# Patient Record
Sex: Male | Born: 1955 | Race: White | Hispanic: No | Marital: Single
Health system: Southern US, Community
[De-identification: ages and names within clinical notes are randomized; demographics above are authoritative.]

---

## 2013-06-17 ENCOUNTER — Ambulatory Visit: Payer: Self-pay | Admitting: Physician Assistant

## 2013-06-19 ENCOUNTER — Ambulatory Visit: Payer: Self-pay | Admitting: Physician Assistant

## 2013-06-21 LAB — WOUND CULTURE

## 2013-07-03 ENCOUNTER — Ambulatory Visit: Payer: Self-pay | Admitting: Family Medicine

## 2013-07-03 ENCOUNTER — Inpatient Hospital Stay: Payer: Self-pay | Admitting: Internal Medicine

## 2013-07-03 LAB — CBC
HCT: 35.4 % — ABNORMAL LOW (ref 40.0–52.0)
HGB: 12.1 g/dL — AB (ref 13.0–18.0)
MCH: 29.9 pg (ref 26.0–34.0)
MCHC: 34.3 g/dL (ref 32.0–36.0)
MCV: 87 fL (ref 80–100)
PLATELETS: 244 10*3/uL (ref 150–440)
RBC: 4.05 10*6/uL — AB (ref 4.40–5.90)
RDW: 13.8 % (ref 11.5–14.5)
WBC: 13.6 10*3/uL — AB (ref 3.8–10.6)

## 2013-07-03 LAB — COMPREHENSIVE METABOLIC PANEL
ALK PHOS: 97 U/L
Albumin: 3.4 g/dL (ref 3.4–5.0)
Anion Gap: 6 — ABNORMAL LOW (ref 7–16)
BUN: 13 mg/dL (ref 7–18)
Bilirubin,Total: 0.4 mg/dL (ref 0.2–1.0)
Calcium, Total: 8.5 mg/dL (ref 8.5–10.1)
Chloride: 89 mmol/L — ABNORMAL LOW (ref 98–107)
Co2: 28 mmol/L (ref 21–32)
Creatinine: 0.85 mg/dL (ref 0.60–1.30)
EGFR (African American): >60
Glucose: 172 mg/dL — ABNORMAL HIGH (ref 65–99)
Osmolality: 252 (ref 275–301)
POTASSIUM: 3.5 mmol/L (ref 3.5–5.1)
SGOT(AST): 17 U/L (ref 15–37)
SGPT (ALT): 18 U/L (ref 12–78)
Sodium: 123 mmol/L — ABNORMAL LOW (ref 136–145)
TOTAL PROTEIN: 8.1 g/dL (ref 6.4–8.2)

## 2013-07-03 LAB — SEDIMENTATION RATE: ERYTHROCYTE SED RATE: 69 mm/h — AB (ref 0–20)

## 2013-07-04 ENCOUNTER — Ambulatory Visit: Payer: Self-pay

## 2013-07-04 LAB — HEMOGLOBIN A1C: HEMOGLOBIN A1C: 8.4 % — AB (ref 4.2–6.3)

## 2013-07-04 LAB — VANCOMYCIN, TROUGH: VANCOMYCIN, TROUGH: 23 ug/mL — AB (ref 10–20)

## 2013-07-05 LAB — CBC WITH DIFFERENTIAL/PLATELET
BASOS ABS: 0.1 10*3/uL (ref 0.0–0.1)
BASOS PCT: 1.3 %
EOS PCT: 3.6 %
Eosinophil #: 0.3 10*3/uL (ref 0.0–0.7)
HCT: 34.8 % — ABNORMAL LOW (ref 40.0–52.0)
HGB: 12 g/dL — AB (ref 13.0–18.0)
LYMPHS PCT: 13.1 %
Lymphocyte #: 1.1 10*3/uL (ref 1.0–3.6)
MCH: 30.2 pg (ref 26.0–34.0)
MCHC: 34.6 g/dL (ref 32.0–36.0)
MCV: 87 fL (ref 80–100)
MONOS PCT: 11.3 %
Monocyte #: 0.9 x10 3/mm (ref 0.2–1.0)
Neutrophil #: 5.9 10*3/uL (ref 1.4–6.5)
Neutrophil %: 70.7 %
Platelet: 267 10*3/uL (ref 150–440)
RBC: 3.99 10*6/uL — ABNORMAL LOW (ref 4.40–5.90)
RDW: 13.3 % (ref 11.5–14.5)
WBC: 8.4 10*3/uL (ref 3.8–10.6)

## 2013-07-08 LAB — CULTURE, BLOOD (SINGLE)

## 2013-07-09 LAB — WOUND CULTURE

## 2013-07-10 LAB — PATHOLOGY REPORT

## 2013-07-29 ENCOUNTER — Ambulatory Visit: Payer: Self-pay | Admitting: Podiatry

## 2013-07-29 LAB — CBC WITH DIFFERENTIAL/PLATELET
BASOS ABS: 0.1 10*3/uL (ref 0.0–0.1)
BASOS PCT: 1.1 %
Eosinophil #: 0.8 10*3/uL — ABNORMAL HIGH (ref 0.0–0.7)
Eosinophil %: 8 %
HCT: 32.8 % — ABNORMAL LOW (ref 40.0–52.0)
HGB: 11.4 g/dL — AB (ref 13.0–18.0)
Lymphocyte #: 1.6 10*3/uL (ref 1.0–3.6)
Lymphocyte %: 16.6 %
MCH: 30.2 pg (ref 26.0–34.0)
MCHC: 34.8 g/dL (ref 32.0–36.0)
MCV: 87 fL (ref 80–100)
MONO ABS: 1.1 x10 3/mm — AB (ref 0.2–1.0)
Monocyte %: 11.2 %
NEUTROS PCT: 63.1 %
Neutrophil #: 6.1 10*3/uL (ref 1.4–6.5)
Platelet: 180 10*3/uL (ref 150–440)
RBC: 3.77 10*6/uL — AB (ref 4.40–5.90)
RDW: 14.5 % (ref 11.5–14.5)
WBC: 9.7 10*3/uL (ref 3.8–10.6)

## 2013-08-14 ENCOUNTER — Ambulatory Visit: Payer: Self-pay | Admitting: Podiatry

## 2013-08-18 LAB — WOUND CULTURE

## 2013-08-19 LAB — PATHOLOGY REPORT

## 2013-10-07 ENCOUNTER — Encounter: Payer: Self-pay | Admitting: Surgery

## 2013-10-14 ENCOUNTER — Encounter: Payer: Self-pay | Admitting: Surgery

## 2013-10-15 ENCOUNTER — Encounter: Payer: Self-pay | Admitting: Surgery

## 2013-11-14 ENCOUNTER — Encounter: Payer: Self-pay | Admitting: Surgery

## 2013-11-25 ENCOUNTER — Ambulatory Visit: Payer: Self-pay

## 2014-08-07 NOTE — Consult Note (Signed)
PATIENT NAME:  Andrew Mcguire, Becket B MR#:  [id number] DATE OF BIRTH:  04/14/1956  DATE OF CONSULTATION:  07/04/2013  CONSULTING PHYSICIAN:  Linus Galasodd Tiondra Fang, DPM  REASON FOR CONSULTATION: This is a 59 year old male with a history of a sore on his right great toe that started back in January. He denies any specific injury. He did present a couple of times to an acute care, where he was diagnosed with an infection and given some antibiotics. He states that this area started as a callus. The infection continued to progress, and he was sent to the Emergency Department due to the extent of the infection. Records show that the patient is diabetic with some neuropathy, but the patient states in his own words that he did not know he was diabetic until this admission. He states he has talked to his primary care doctor on several occasions over the last 4 to 5 years about poor circulation because he has noticed the hair growth in his feet disappearing. This was apparently never addressed in the past.   PAST MEDICAL HISTORY:  1. Hypertension for the last 20 years.  2. Diabetes mellitus.  3. Chronic kidney disease.   PAST SURGICAL HISTORY:  1. Benign bone tumor, left leg, age 59.  2. Tonsillectomy.   ALLERGIES: No known drug allergies.   HOME MEDICATIONS:  1. Hydrochlorothiazide 25 mg daily.  2. Glimepiride 1 mg daily.  3. Gabapentin 300 mg 3 times a day.  4. Enalapril 40 mg once a day.  5. Atorvastatin 40 mg daily.  6. Aspirin 81 mg daily.   FAMILY HISTORY: Glaucoma, cancer.   SOCIAL HISTORY: The patient relates a 1-pack-a-day smoking habit with 40- to 45-pack-year  history. Drinks 2 to 3 beers daily. He is widowed. 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   REVIEW OF SYSTEMS:  CONSTITUTIONAL: Denies any fever, chills, weight loss or generalized weakness.  EYES: Denies vision changes.  EARS, NOSE, THROAT: No hearing changes.  RESPIRATORY: Mild productive cough.  CARDIOVASCULAR: Denies  chest pains or palpitations.  GASTROINTESTINAL: No nausea, vomiting or pain.  GENITOURINARY: No dysuria or incontinence.  ENDOCRINE: History of diabetes per records. The patient denies previous knowledge. HEMATOLOGIC: No bruising or bleeding.  SKIN: Some redness and swelling with some black discoloration on the right great toe. Some drainage.  MUSCULOSKELETAL: Adequate range of motion and muscle testing.  NEUROLOGIC: Loss of protective threshold as well as absence of proprioception.   PHYSICAL EXAMINATION:  VASCULAR: DP pulse is 1/4 on the left, trace at best on the right. PT pulse could not be palpated bilateral. Capillary filling time a little delayed on the toes on the right as compared to the left.  NEUROLOGICAL: Loss of protective threshold in the toes bilateral. Proprioception is impaired.  INTEGUMENT: The skin is warm, dry and somewhat atrophic, with some mild hyperpigmentation changes. Significant cellulitis in the right great toe with a mild streak extending up onto the midfoot. A black eschar medially over the hallux IPJ area with a fluctuant gangrenous change more proximal along the medial side of the great toe. There is a significant malodor suggestive of anaerobic infection.  MUSCULOSKELETAL: Adequate range of motion. Muscle testing is 5/5.   X-RAYS: Multiple views of the right foot reveal some lucency at the medial base of the distal phalanx on the right great toe. On the lateral view, possibly an old fracture present. There are noted to be some early signs of subcutaneous gas on the medial aspect  of the proximal phalanx suggestive of early gangrene.   PLAN: Excisional debridement of some of the devitalized tissue full thickness with some subcutaneous tissue performed at bedside sharply using 15-blade and tissue nippers. The ulcer wound is approximately 3.5 cm x 1.5 cm. A light bandage was applied. The patient will be n.p.o., and we will plan for debridement of the infected tissues  with probable amputation of the great toe later on today. Consent form for the same. We will also consult [facility] as most likely there is some vascular compromise to the right foot at this point. We will plan for surgery later on today after the patient has MRI, which is already scheduled.   ____________________________ Linus Galas, DPM tc:lb D: 07/04/2013 11:24:49 ET T: 07/04/2013 12:14:43 ET JOB#: [id number]  cc: Linus Galas, DPM, <Dictator> Marieta Markov DPM ELECTRONICALLY SIGNED 07/04/2013 16:06

## 2014-08-07 NOTE — Discharge Summary (Signed)
PATIENT NAME:  Andrew Mcguire, Diron B MR#:  [id number] DATE OF BIRTH:  01-16-1956  DATE OF ADMISSION:  07/03/2013 DATE OF DISCHARGE:  07/06/2013  PRIMARY CARE PHYSICIAN: At the [facility].   FINAL DIAGNOSES:  1.  Osteomyelitis status post toe amputation. Outpatient culture grew Acinetobacter.  2.  Peripheral vascular disease status post angioplasty.  3.  Diabetes with neuropathy.  4.  Hypertension.  5.  Hyperlipidemia.  6.  Tobacco abuse.   MEDICATIONS ON DISCHARGE: Include atenolol 100 mg daily, gabapentin 300 mg three  times a day, atorvastatin 40 mg at bedtime, glimepiride 1 mg daily, enalapril 20 mg two tablets daily, aspirin 81 mg daily, acetaminophen hydrocodone 325/5 mg one tablet every 6 hours as needed for pain, Cipro 500 mg every 12 hours for 14 days, Cleocin hydrochloride 300 mg one  tablet every 8 hours for 14 days.   Stop taking hydrochlorothiazide.   HOME HEALTH: None.   DRESSING CARE: Keep dressing, right foot, on until seen by Dr. Alberteen Spindleline in the office.   DIET: Low sodium diet, carbohydrate-controlled diet, regular consistency.   ACTIVITY: As tolerated.   FOLLOWUP: With Dr. Alberteen Spindleline, podiatry, this week; Dr. Wyn Quakerew, vascular surgery, 3 weeks; Dr. Sampson GoonFitzgerald, infectious diseases, 2 weeks; 1 to 2 weeks with your medical doctor.  HOSPITAL COURSE: The patient was admitted 07/03/2013, discharged 07/06/2013. Came in with cellulitis and possible osteomyelitis of the toe. The patient was initially started on vancomycin and Zosyn. I switched the antibiotics on March 21 to meropenem and vancomycin secondary to previous culture growing out acinetobacter.    OPERATIONS: Dr. Alberteen Spindleline did a bedside debridement on 07/04/2013 and took to the operating room for a toe amputation on 07/04/2013. Dr. Wyn Quakerew did an angiogram with angioplasties of the right lower extremity. Please see all of those operative reports.   LABORATORY AND RADIOLOGICAL DATA DURING THE HOSPITAL COURSE: Included a  sedimentation rate of 69, CRP 109, glucose 172, BUN 13, creatinine 0.85, sodium 123, potassium 3.5, chloride 89, CO2 of 28, calcium 8.5. Liver function tests normal range. White blood cell count 13.6, H and H 12.1 and 35.2, platelet count of 244.   Right foot showed poorly defined lucency at the base of the distal phalanx of the great toe suspicious for osteomyelitis.   Blood cultures negative. Hemoglobin A1c 8.4.   EKG: Normal sinus rhythm. No acute ST-T wave changes.   MRI of the right foot showed findings consistent with osteomyelitis of the first distal phalanx of the right foot. Minimal edema.   Wound culture holding for possible pathogen.   White count upon discharge, 8.4.   HOSPITAL COURSE PER PROBLEM LIST:  1.  For the patient's osteomyelitis of the toe, he is status post amputation by Dr. Alberteen Spindleline on 07/04/2013. The outpatient culture grew acinetobacter. I did speak with Dr. Sampson GoonFitzgerald, infectious diseases, who recommended Cipro and Cleocin for a 14-day course and follow up with him in 2 weeks. Since the infection was removed with the amputation, hopefully 2 weeks of oral antibiotics will cover the infection.  2.  Peripheral vascular disease, status post angioplasty by Dr. Wyn Quakerew on 07/06/2013. Hopefully this will provide better blood flow so the wound can heal. The patient must stop smoking. The patient is on aspirin.  3.  Diabetes with neuropathy, on gabapentin and glimepiride.  4.  Hypertension. Blood pressure stable on atenolol and enalapril. I did stop the hydrochlorothiazide with the patient's low sodium.  5.  Hyperlipidemia. Continue atorvastatin.  5.  Tobacco abuse. Smoking  cessation counseling done 3 minutes by me.   I do recommend checking a BMP in followup appointment.   TIME SPENT ON DISCHARGE: 35 minutes.   ____________________________ Herschell Dimes. Renae Gloss, MD rjw:np D: 07/06/2013 16:24:32 ET T: 07/06/2013 20:22:02 ET JOB#: [id number]  cc: Herschell Dimes. Renae Gloss, MD,  <Dictator> Linus Galas, [place] Annice Needy, MD Stann Mainland. Sampson Goon, MD Salley Scarlet MD ELECTRONICALLY SIGNED 07/10/2013 16:28

## 2014-08-07 NOTE — Op Note (Signed)
PATIENT NAME:  Teena IraniSEAWELL, Nicki B MR#:  [id number] DATE OF BIRTH:  1956-01-09  DATE OF PROCEDURE:  08/14/2013  SURGEON:  Ricci Barkerodd W. Graiden Henes, DPM   PREOPERATIVE DIAGNOSIS: Osteomyelitis, right first metatarsal.  POSTOPERATIVE DIAGNOSIS: Osteomyelitis, right first metatarsal.   PROCEDURE: Debridement bone, right first metatarsal.   ANESTHESIA: General.   HEMOSTASIS: None.   ESTIMATED BLOOD LOSS: 50 mL.   PATHOLOGY: Bone, right first metatarsal head.   CULTURES: Bone culture, right first metatarsal.   MATERIALS: None.   COMPLICATIONS: None apparent.   OPERATIVE INDICATIONS: This is a 59 year old male with a history of diabetes and peripheral vascular disease who recently underwent amputation of the right great toe. Had some skin breakdown with the first metatarsal head visible through the wound and decision was made for re-debridement.   OPERATIVE PROCEDURE: The patient was taken to the operating room and placed on the table in the supine position. Following satisfactory anesthesia, the right foot was anesthetized with 10 mL of 0.5% Sensorcaine plain around the first metatarsal. The foot was prepped and draped in the usual sterile fashion. Attention was directed towards the distal aspect of the right foot at the amputation site where the bone was exposed. A medial incision was made along the metatarsal head and shaft straight down to bone. The capsular and periosteal tissues were reflected off of the head of the first metatarsal, as well as the sesamoid apparatus. Using a pneumatic saw, the head of the metatarsal was resected and removed in toto along the sesamoids. Some devitalized skin slough was noted along the lateral aspect of the old incision and this was removed using a #15 blade. The wound was then excisionally debrided for all layers from superficial and deep subcutaneous tissue down to the level of bone with a VersaJet debrider on a setting of 6. The wound was flushed with copious amounts  of sterile saline. There was good, healthy, bleeding tissues. The wound was then closed primarily using 4-0 nylon, vertical mattress and simple interrupted sutures. Xeroform, 4 x 4's, ABDs, Kerlix and an Ace wrap were then applied. The patient tolerated the procedure and anesthesia well and was transported to the PACU with vital signs stable and in good condition.   ____________________________ Linus Galasodd Zaeda Mcferran, DPM tc:ce D: 08/14/2013 14:18:49 ET T: 08/14/2013 15:28:06 ET JOB#: [id number]  cc: Linus Galasodd Kenlie Seki, DPM, <Dictator> Chassie Pennix DPM ELECTRONICALLY SIGNED 08/25/2013 15:58

## 2014-08-07 NOTE — Op Note (Signed)
PATIENT NAME:  Andrew Mcguire, Yadir B MR#:  [id number] DATE OF BIRTH:  07/22/1955  DATE OF PROCEDURE:  07/04/2013  PREOPERATIVE DIAGNOSIS: Osteomyelitis with gangrenous changes, right great toe.   POSTOPERATIVE DIAGNOSIS: Osteomyelitis with gangrenous changes, right great toe.   PROCEDURE: Amputation, right great toe.   SURGEON: Linus Galasodd Gaylord Seydel, DPM.   ANESTHESIA: Local MAC.   HEMOSTASIS: None.   ESTIMATED BLOOD LOSS: 25 milliliters.   MATERIALS: None.   DRAINS: None.   COMPLICATIONS: None apparent.   CULTURES: Bone culture right great toe distal phalanx.   OPERATIVE INDICATIONS: This is a 59 year old male recently admitted for a progressive ulceration and infection in his right great toe. X-rays and MRI showed osteomyelitis in the right great toe distal and some involvement in the proximal phalanx, gangrenous changes to the medial aspect of the toe and decision was made for amputation.   OPERATIVE PROCEDURE: The patient was taken to the operating room and placed in on the table in the supine position. Following satisfactory sedation, the right foot was anesthetized with 10 milliliters of 0.5% Sensorcaine plain. The foot was then prepped and draped in the usual sterile fashion.   Attention was then directed to the distal aspect of the right foot where a racquet-type incision was made coursing from medial to lateral excising the gangrenous and necrotic tissue from the medial aspect. Dissection was then carried down along the bone proximally and laterally back to the level of the metatarsophalangeal joint where the toe was disarticulated and removed in toto. There was noted to be significant purulence at the hallux IPJ and the distal phalanx. Some mild devitalized purulent and necrotic tissue noted at the periphery of the incision. Next, excisional debridement was performed with a Versajet debrider on a setting of 4 to 6 for all layers to the deep and superficial subcutaneous tissue and to the level  of the bone. The wound was then more gently irrigated and debriding on a setting of 2.   The wound was then flushed with sterile saline and closed using 4-0 nylon simple interrupted sutures.   The patient tolerated the procedure and anesthesia well and was transported to the PACU with vital signs stable and in good condition.    ____________________________ Linus Galasodd Derrill Bagnell, DPM [phone] D: 07/04/2013 18:13:19 ET T: 07/04/2013 22:20:33 ET JOB#: [id number]  cc: Linus Galasodd Lula Kolton, DPM, <Dictator> Kylyn Mcdade DPM ELECTRONICALLY SIGNED 08/04/2013 11:33

## 2014-08-07 NOTE — H&P (Signed)
PATIENT NAME:  MASTON, WIGHT MR#:  [id number] DATE OF BIRTH:  [birth year]  DATE OF ADMISSION:  07/03/2013   PRIMARY CARE PHYSICIAN:   at [facility].  REFERRING PHYSICIAN:  Darien Ramus, MD   CHIEF COMPLAINT: Right foot swelling and pain, drainage from the right foot.   HISTORY OF PRESENT ILLNESS: Mr. Allman is a 59 year old male with known history of diabetes mellitus, hypertension developed a callus on the right big toe in January 2015. Noted to have open wound at that time. Continued with wound care with dressings. In March noted to have increased redness and swelling. Concerning this, went to the urgent care where patient was given levofloxacin. Cultures grew out Acinetobacter baumannii which was sensitive to levofloxacin. After the antibiotics there was some improvement however, continued to have increased redness. Concerning this, went back to urgent care center who referred the patient to the Emergency Department. The patient states that he does not have a good sensation in his feet, experiences some sharp pain from time to time. Denies having any fever.  The patient received vancomycin and Zosyn in the Emergency Department.   PAST MEDICAL HISTORY: 1. Hypertension for the last 20 years.  2. Diabetes mellitus.  3. Chronic kidney disease.   PAST SURGICAL HISTORY: Bone  removal.   ALLERGIES: No known drug allergies.   HOME MEDICATIONS: 1. Hydrochlorothiazide 25 milligrams once a day.  2. Glimepiride 1 milligrams once a day.  3. Gabapentin 300 milligrams 3 times a day.  4. Enalapril 40 milligrams once a day.  5. Atorvastatin 40 milligrams once a day.   6.  100 milligrams once a day. 7. Aspirin 81 milligrams once a day.   SOCIAL HISTORY: Continues to smoke 1 pack a day. Drinks 2 to 3 beers every night. Denies using any illicit drugs. Lives by himself, widowed. 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  FAMILY HISTORY: Glaucoma. Father died  of bladder cancer.   REVIEW OF SYSTEMS: CONSTITUTIONAL: Denies any generalized weakness.  EYES: No change in vision.  EARS, NOSE, THROAT: No change in hearing.  RESPIRATORY: Has some cough with mild productive sputum.  CARDIOVASCULAR: No chest pain, palpations.  GASTROINTESTINAL: No nausea, vomiting, abdominal pain.  GENITOURINARY: No dysuria or hematuria.  ENDOCRINE: Has diagnosis of diabetes mellitus.  HEMATOLOGIC: No easy bruising or bleeding.  SKIN: Has redness and swelling of the right foot.  MUSCULOSKELETAL: No joint pains     NEUROLOGIC: The patient has neuropathy. No weakness in any part of the body.   PHYSICAL EXAMINATION: GENERAL: This is a well-built, well-nourished, age-appropriate male lying down in the bed, not in distress.  VITAL SIGNS: Temperature 98.5, pulse 87, blood pressure 122/68, respiratory rate of 16, oxygen saturation 99% on room air.  HEENT: Head normocephalic. No scleral icterus. Conjunctivae normal. Pupils equal and react to light. Extraocular movements are intact. Mucous membranes moist.     NECK: Supple. No lymphadenopathy. No JVD. No carotid bruit.  CHEST: Has no focal tenderness.  LUNGS: Has occasional wheezing.  HEART: S1, S2 regular. No murmurs are heard.  ABDOMEN: Bowel sounds present. Soft, nontender, nondistended. No hepatosplenomegaly.  EXTREMITIES: Right great toe has an ulcer about 2 to 3 centimeter diameter with redness involving the right dorsal aspect of the foot, gangrenous proximal to the wound. MUSCULOSKELETAL: Good range of motion in all the extremities.  NEUROLOGICAL: The patient is alert, oriented and time. Cranial nerves II through XII intact. Motor 5/5 in upper and lower extremities.  LABORATORY DATA: CMP is completely within normal limits. Sedimentation rate 69.   X-ray  of the foot, poorly defined lucency at the base of the distal phalanx of the right great toe, with an area of discontinuity, suspicious for osteomyelitis. CBC: WBC  of 13.6, hemoglobin 12, platelet count of 244,000.   1. Cellulitis and osteomyelitis of the right great toe. Keep the patient on vancomycin and Zosyn. The patient already received antibiotics. The patient will need to get the bone cultures. Wound cultures grew Acinetobacter baumannii. Involve infectious disease, obtain MRI of the foot, also get ankle brachial index, look into the blood flow to the great toe.   2. Diabetes mellitus, on oral medication. We will obtain  hemoglobin A1c. It looks like the patient has poorly controlled blood sugars.  3. Diabetic neuropathy. The patient is on gabapentin.  4. Hypertension, currently well-controlled. Continue with the home medications.  5. Tobacco use. Counseled with the patient. The patient seems to have poor insight.  6. Alcohol use. Keep the patient on CIWA protocol.  7. Keep the patient on deep vein thrombosis prophylaxis with Lovenox.   TIME SPENT: Fifty minutes.    ____________________________ Susa GriffinsPadmaja Eh Sesay, MD [phone] D: 07/03/2013 23:51:17 ET T: 07/04/2013 01:06:43 ET JOB#: [id number]  cc: Susa GriffinsPadmaja Zaelyn Noack, MD, <Dictator> Susa GriffinsPADMAJA Kajol Crispen MD ELECTRONICALLY SIGNED 07/18/2013 4:18

## 2014-08-07 NOTE — Consult Note (Signed)
PATIENT NAME:  Andrew Mcguire, Andrew Mcguire MR#:  [id number] DATE OF BIRTH:  [birth year]  DATE OF CONSULTATION:  07/06/2013  REFERRING PHYSICIAN:  Tana Conch. Leslye Peer, MD CONSULTING PHYSICIAN:  Cheral Marker. Ola Spurr, MD  PODIATRIST: Sharlotte Alamo, DPM  VASCULAR PHYSICIAN: Algernon Huxley, MD  HISTORY OF PRESENT ILLNESS: This is a very pleasant 59 year old gentleman with poorly controlled diabetes as well as peripheral vascular disease, who was admitted March 20 with an infected right great toe. This had begun for several months as a callus. It then became infected and he was seen in an urgent care center, where he was started on levofloxacin. He had some mild debridement of it at that site. Cultures ended up growing Acinetobacter baumannii, which was sensitive to levofloxacin. However, once he stopped the antibiotics, he had increasing redness and swelling. He was admitted and started on vancomycin and Zosyn. On March 21, he underwent debridement with amputation of the right great toe. He has been maintained on antibiotics since then. Cultures are no growth to date. He also underwent attempt at revascularization today by Dr. Lucky Cowboy. It was not 100% successful, but there was some flow restored.   The patient currently denies any fevers, chills, night sweats. He does have significant peripheral neuropathy in the leg. He does still smoke a pack a day.   PAST MEDICAL HISTORY: 1.  Diabetes, poorly controlled.  2.  Peripheral vascular disease.  3.  Hypertension.  4.  Chronic kidney disease.  5.  Tobacco abuse.   PAST SURGICAL HISTORY: None.   ALLERGIES: No known drug allergies.   ANTIBIOTICS SINCE ADMISSION: Vancomycin and meropenem.   SOCIAL HISTORY: The patient smokes 1 pack per day. Drinks 2 to 3 beers a night. Lives by himself. He is widowed. Continues to work, mainly [employer] in [place].   FAMILY HISTORY: Positive for glaucoma. His father died of bladder cancer.   REVIEW OF SYSTEMS:  Eleven systems reviewed and negative except as per HPI.   PHYSICAL EXAMINATION: VITAL SIGNS: Temperature 98.3, pulse 79, blood pressure 151/79, sats 97% on room air.  GENERAL: He is pleasant, interactive, lying in bed.  HEENT: Pupils equal, round, reactive to light and accommodation. Extraocular movements are intact. Sclerae anicteric. Oropharynx clear.  NECK: Supple.  HEART: Regular.  LUNGS: Clear to auscultation bilaterally.  ABDOMEN: Soft, nontender, nondistended.  EXTREMITIES: Right leg is bandaged postoperatively. He does have hair loss on his anterior shins. His left foot is slightly cool.   LABORATORY DATA: Labs were reviewed. On March 4, he had culture of his wound, which grew moderate Acinetobacter baumannii. There were also noted to be a few gram-positive cocci in pairs and in clusters and rare gram-variable rods. Blood culture March 20 was negative x 2. Wound culture March 21 surgically is holding for possible pathogen. ESR done on March 20 was 69. White count initially was 13.6, now down to 8.4, hemoglobin 12.0, platelets 267. Renal function shows a creatinine of 0.85. Hemoglobin A1c is 8.5.   IMPRESSION: A 60 year old with poorly controlled diabetes, peripheral vascular disease, ongoing tobacco abuse, admitted with osteomyelitis and gangrene of the right great toe. He is now status post amputation. Cultures are pending. Prior culture had grown Acinetobacter. Most of the infected tissue has been removed. Revascularization was attempted and was partially successful today.   RECOMMENDATIONS: 1.  I have given the patient a prescription for ciprofloxacin 500 mg twice a day and clindamycin 300 mg t.i.d. He will take this for 2 weeks and then  follow up in my clinic. He may need a more prolonged course.  2.  I discussed with the patient smoking cessation and better diabetes control. He has a combination of peripheral vascular disease, and I advised him he is at high risk for further loss of  limb if he does not get more aggressive about managing his lifestyle risk factors.  3.  Continue vascular followup with Dr. Dew.  4.  I will see the patient in 2 weeks in followup in clinic.   Thank you for the consult. I will be glad to follow with you.   ____________________________ David P. Fitzgerald, MD dpf:jcm D: 07/06/2013 14:28:10 ET T: 07/06/2013 15:18:58 ET JOB#: [id number]  cc: David P. Fitzgerald, MD, <Dictator> DAVID FITZGERALD MD ELECTRONICALLY SIGNED 07/18/2013 13:27 

## 2014-08-07 NOTE — Consult Note (Signed)
Brief Consult Note: Diagnosis: Osteomyelitis, PVD, DM, tobacco abuse.   Patient was seen by consultant.   Consult note dictated.   Recommend further assessment or treatment.   Orders entered.   Discussed with Attending MD.   Comments: Cipro 500 bd and clinda 300 tid RX given to pt for 2 weeks I will see in 2 weeks in clinic.  Electronic Signatures: Dierdre HarnessFitzgerald, David Patrick (MD)  (Signed 23-Mar-15 14:28)  Authored: Brief Consult Note   Last Updated: 23-Mar-15 14:28 by Dierdre HarnessFitzgerald, David Patrick (MD)

## 2014-08-07 NOTE — Op Note (Signed)
PATIENT NAME:  Andrew Mcguire, Andrew Mcguire MR#:  [id number] DATE OF BIRTH:  [birth year]  DATE OF PROCEDURE:  07/06/2013  PREOPERATIVE DIAGNOSES: 1.  Peripheral arterial disease with ulceration and infection, right lower extremity.  2.  Status post digital amputation, right lower extremity.  3.  Diabetes.  4.  Hypertension.   POSTOPERATIVE DIAGNOSES:  1.  Peripheral arterial disease with ulceration and infection, right lower extremity.  2.  Status post digital amputation, right lower extremity.  3.  Diabetes.  4.  Hypertension.   PROCEDURES:  1.  Ultrasound guidance for vascular access, left femoral artery.  2.  Catheter placement to right posterior tibial artery from left femoral approach.  3.  Aortogram and selective right lower extremity angiogram.  4.  Percutaneous transluminal angioplasty of right posterior tibial artery with 3 mm diameter angioplasty balloon.  5.  Percutaneous transluminal angioplasty with conventional drug-eluting balloon to the distal superficial femoral artery with 5 mm diameter angioplasty balloon.  6.  StarClose closure device, left femoral artery.   SURGEON: Annice Needy, M.D.   ANESTHESIA: Local with moderate conscious sedation.   ESTIMATED BLOOD LOSS: 25 mL.   INDICATION FOR PROCEDURE: This is a 59 year old white male who was brought into the hospital for infection and ulcerations of the right lower extremity. He had digital amputations from Saturday. To address vascular disease, he is brought down for angiogram today.   DESCRIPTION OF PROCEDURE: The patient is brought to the vascular suite. Groins were shaved and prepped and a sterile surgical field was created. The left femoral head was localized with fluoroscopy. The left femoral artery was visualized with ultrasound and found to be patent. It was then accessed under direct ultrasound guidance without difficulty with a Seldinger needle. A J-wire and 5-French sheath were placed. Pigtail catheter was placed in the  aorta at the L1-L2 level. AP aortogram was performed. This demonstrated some mild irregularity in both iliacs. It did not appear flow limiting. The renal arteries were widely patent. I then crossed the aortic bifurcation with the pigtail catheter and the Terumo Advantage wire. I advanced this into the right common femoral artery. Selective right lower extremity angiogram was then performed. This showed a near occlusive stenosis in the distal superficial femoral artery over a 2 to 3 cm segment. The patient then had significant tibial disease. The anterior tibial artery was chronically occluded. The peroneal artery was small. The posterior tibial artery was the best runoff distally. It had moderate stenosis proximally and in the foot there was also at least moderate stenosis distally. The patient was heparinized with 4,000 units of intravenous Heparin and a 6-French Ansell sheath was placed over a Terumo Advantage wire. I crossed the SFA lesion without difficulty and got into the posterior tibial artery. I could not cross the lesion in the foot easily and the patient was very noncompliant and moved the entire procedure making it quite difficult. I treated the proximal posterior tibial lesion was a 3 mm diameter angioplasty balloon. The SFA lesion was then treated with a 5 mm diameter conventional and then a 5 mm diameter drug-eluting angioplasty balloon with an excellent angiographic completion result and less than 30% residual stenosis. At this point, I elected to terminate the procedure. The sheath was pulled back to the ipsilateral external iliac artery and oblique arteriogram was performed. StarClose closure device was deployed in the usual fashion with an excellent hemostatic result. The patient tolerated the procedure well and was taken the recovery room in stable condition.  ____________________________ Annice NeedyJason S. Atziri Zubiate, MD jsd:sb D: 07/06/2013 10:32:08 ET T: 07/06/2013 11:05:29 ET JOB#: [id number]  cc: Annice NeedyJason S.  Callie Bunyard, MD, <Dictator> Annice NeedyJASON S Shamiyah Ngu MD ELECTRONICALLY SIGNED 07/20/2013 9:48

## 2014-08-07 NOTE — Consult Note (Signed)
Brief Consult Note: Diagnosis: infected right toe.   Comments: will plan for angio on monday NPO after midnight sunday vascular lab studies.  Electronic Signatures: Nada LibmanBrabham, Shalom Ware W (MD)  (Signed 21-Mar-15 18:49)  Authored: Brief Consult Note   Last Updated: 21-Mar-15 18:49 by Nada LibmanBrabham, Jyssica Rief W (MD)

## 2014-08-07 NOTE — Consult Note (Signed)
Admit Diagnosis:   CELLULITIS OF TOE: Onset Date: 04-Jul-2013, Status: Active, Description: CELLULITIS OF TOE   Present Illness 59 year old male I have been asked to evaluate for PAD.  The patient presented with a right great toe ulcer.  This initially began as a callus in January. He had been performing local wound care.  In March he developed reddness and swelling.  Cultures grew out Acinetobacter sensitive to Levaquin.  He complpeted his ABx course with mild improvement and then went to tthe ER and was admitted.  He underwent toe amputation by Dr. Alberteen Spindle on 07/04/2013.  Minimal bellding was observed.  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  He has hypercholesterolemia which is managed with a statin.  he is on multiple medications for HTN.  He has chronic renal insufficiency.  He is a current smoker.  He denies any cardiac issues.   Home Medications: Medication Instructions Status  atenolol 100 mg oral tablet 1 tab(s) orally once a day Active  gabapentin 300 mg oral capsule 1 cap(s) orally 3 times a day Active  atorvastatin 40 mg oral tablet 1 tab(s) orally once a day (at bedtime) Active  glimepiride 1 mg oral tablet 1 tab(s) orally once a day Active  enalapril 20 mg oral tablet 2 tab(s) orally once a day Active  hydrochlorothiazide 25 mg oral tablet 1 tab(s) orally once a day Active  Aspir 81  orally  Active    No Known Allergies:   Case History and Physical Exam:  Chief Complaint non-healing right great toe ulcer   Past Medical Health Coronary Artery Disease, Hypertension, Diabetes Mellitus, Cancer, Stroke   Past Surgical History childhood bone tumor removed, left leg   Family History Glaucoma, bladder cancer    SOCIAL HISTORY:  Continues to smoke 1 pack a day. Drinks 2 to 3 beers every night. Denies using any illicit drugs. Lives by himself, widowed. 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   HEENT PERLA    Chest/Lungs Clear   Cardiovascular Normal Sinus Rhythm  Palpable bilateral femoral pulses.  Non-palpable femoral pulses   Abdomen Benign   Musculoskeletal Full range of motion   Neurological decreased sensation bilateral feet   Skin right great toe wound, s/p amputation, dressing intact   Review of Systems:  Fever/Chills No   Cough No   Sputum No   Abdominal Pain No   Diarrhea No   Constipation No   Nausea/Vomiting No   SOB/DOE No   Chest Pain No   Dysuria No   Nursing/Ancillary Notes: **Vital Signs.:   22-Mar-15 04:10  Vital Signs Type Routine  Temperature Temperature (F) 97.7  Celsius 36.5  Temperature Source oral  Pulse Pulse 79  Respirations Respirations 18  Systolic BP Systolic BP 151  Diastolic BP (mmHg) Diastolic BP (mmHg) 79  Mean BP 103  Pulse Ox % Pulse Ox % 90  Pulse Ox Activity Level  At rest  Oxygen Delivery Room Air/ 21 %   Routine Chem:  20-Mar-15 18:19   Glucose, Serum  172  BUN 13  Creatinine (comp) 0.85  Sodium, Serum  123  Potassium, Serum 3.5  Chloride, Serum  89  CO2, Serum 28  Routine Hem:  20-Mar-15 18:19   WBC (CBC)  13.6  Hemoglobin (CBC)  12.1  Hematocrit (CBC)  35.4  Platelet Count (CBC) 244 (Result(s) reported on 03 Jul 2013 at 06:55PM.)  22-Mar-15 05:37   WBC (CBC) 8.4  Hemoglobin (  CBC)  12.0  Hematocrit (CBC)  34.8  Platelet Count (CBC) 267  MCHC 34.6   XRay:    20-Mar-15 18:51, Foot Right Complete  Foot Right Complete   REASON FOR EXAM:    pain/swelling  COMMENTS:       PROCEDURE: DXR - DXR FOOT RT COMPLETE W/OBLIQUES  - Jul 03 2013  6:51PM     CLINICAL DATA:  Infected wound at great toe    EXAM:  RIGHT FOOT COMPLETE - 3+ VIEW    COMPARISON:  None    FINDINGS:  Dressing artifacts at great toe.  Osseous mineralization normal.    Joint spaces preserved.    Bony lucency identified at the base of the distal phalanx great toe.    Cortical discontinuity is seen to base of the distal phalanx on  the  lateral view suspicious for osteomyelitis.    No definite fracture or dislocation.     IMPRESSION:  Poorly defined lucency at the base of the distal phalanx of the left  right great toe with an area of cortical discontinuity on the  lateral view suspicious for osteomyelitis.  No additional regional acute osseous abnormalities identified.      Electronically Signed    By: Ulyses SouthwardMark  Boles M.D.    On: 07/03/2013 18:55         Verified By: Lollie MarrowMARK A. BOLES, M.D.,    Impression PAD with ulceration, right great toe   Plan The patient has recently undergone right great toe amputation by Dr. Alberteen Spindleline for a non-healing wound.  There was marginal bleeding at the time of sgery.  He has non-palpable pedal pulses.  HE has multiple risk factors for PAD including diabetes, HTN, Hypercholesterolemia, and tobacco abuse.  He is at risk for more proximal amputation and will need further work-up of his circulation.  he will be scheduled for angiography on Monday.  He needs arterial duplex studies as well.  He will be NPO after midnight on Sunday.   Electronic Signatures: Nada LibmanBrabham, Ramonica Grigg W (MD)  (Signed 22-Mar-15 07:01)  Authored: Health Issues, General Aspect/Present Illness, Home Medications, Allergies, History and Physical Exam, Vital Signs, Labs, Radiology, Impression/Plan   Last Updated: 22-Mar-15 07:01 by Nada LibmanBrabham, Teriana Danker W (MD)

## 2016-01-05 IMAGING — MR MRI OF THE RIGHT FOREFOOT WITHOUT AND WITH CONTRAST
9 of 10 series · 36 of 40 positions shown · IV contrast (multihance)
Comparison: None.

CLINICAL DATA: Right great toe wound.  Question osteomyelitis.

EXAM:
MRI OF THE RIGHT FOREFOOT WITHOUT AND WITH CONTRAST
TECHNIQUE: Multiplanar, multisequence MR imaging was performed both before and
after administration of intravenous contrast.
CONTRAST:  16 mL MultiHance

[Series 5: T1 · coronal · 3.0mm · 0.62mm/px · 5 of 35 slices shown (1 of 2)]
[im 1/35]
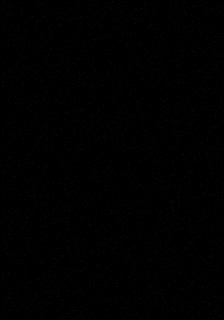
[im 9/35]
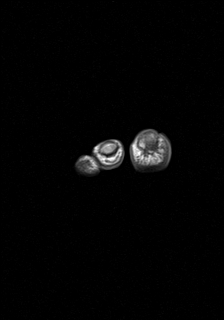
[im 18/35]
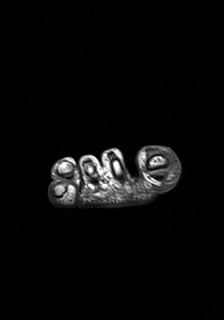
[im 26/35]
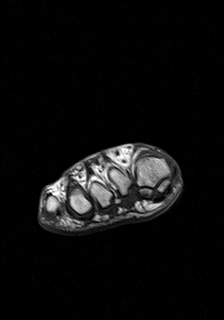
[im 35/35]
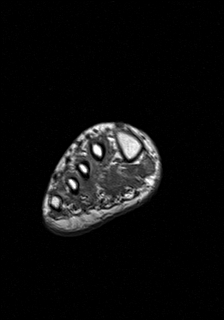

[Series 6: axial t1fs (only · coronal · 3.0mm · 0.82mm/px · 5 of 34 slices shown]
[im 1/34]
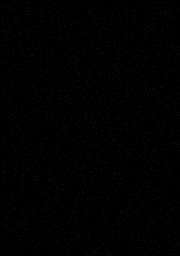
[im 9/34]
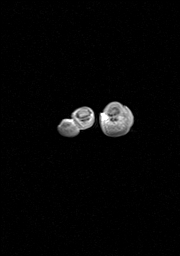
[im 17/34]
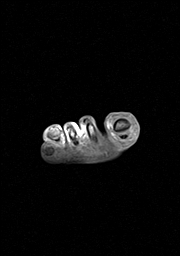
[im 25/34]
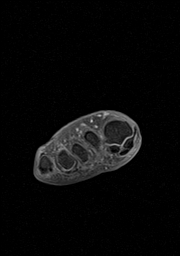
[im 34/34]
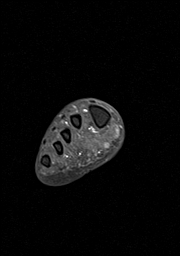

[Series 7: T2 fat-sat · coronal · 3.0mm · 0.43mm/px · 5 of 35 slices shown]
[im 1/35]
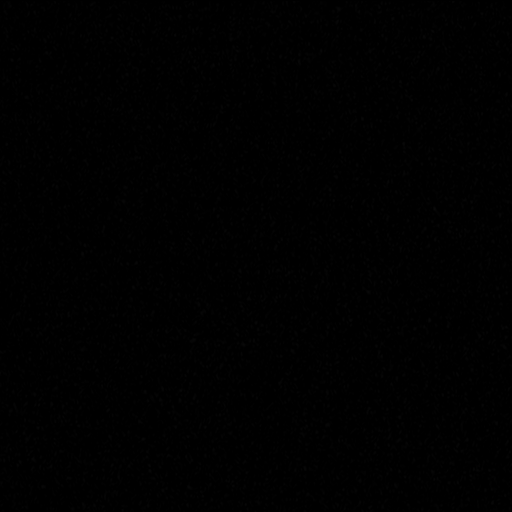
[im 9/35]
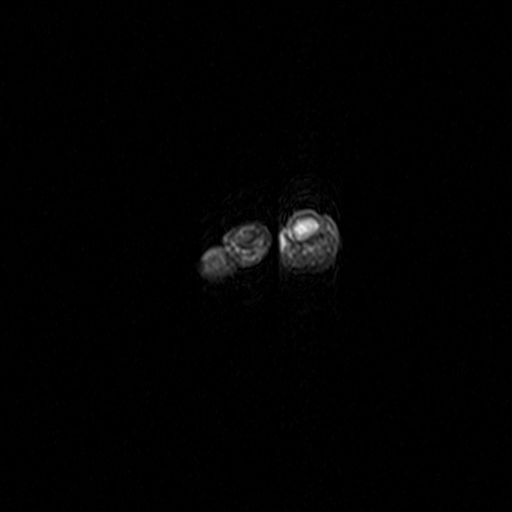
[im 18/35]
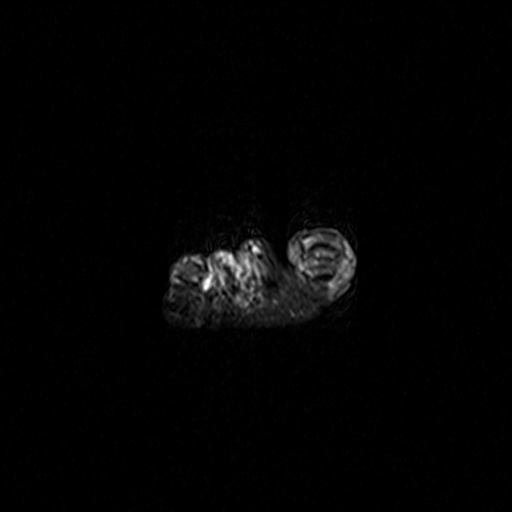
[im 26/35]
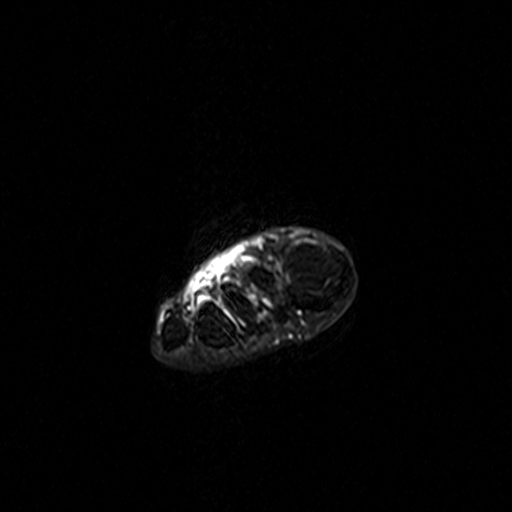
[im 35/35]
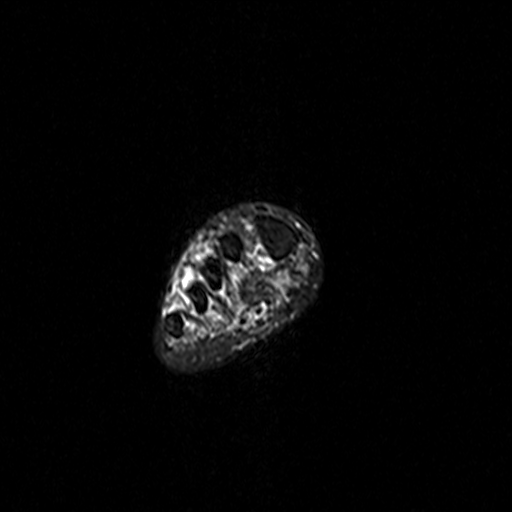

[Series 8: T1 · axial · 3.0mm · 0.66mm/px · z∈[-162,-103]mm · 3 of 23 slices shown (2 of 2)]
[im 1/23]
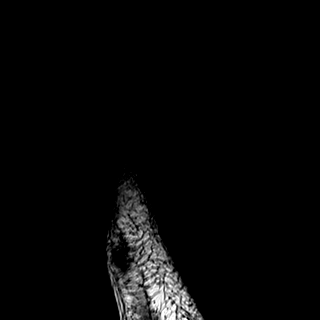
[im 12/23]
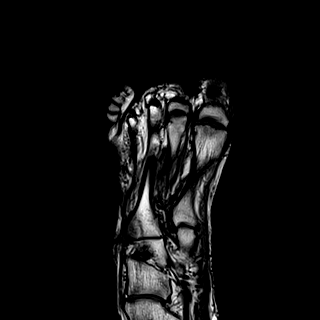
[im 23/23]
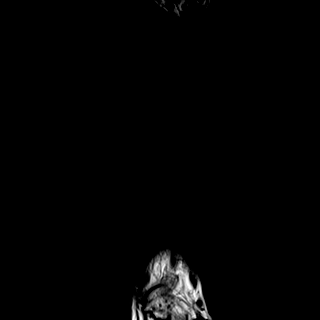

[Series 9: STIR · axial · 3.0mm · 0.41mm/px · z∈[-162,-103]mm · 3 of 23 slices shown (1 of 2)]
[im 1/23]
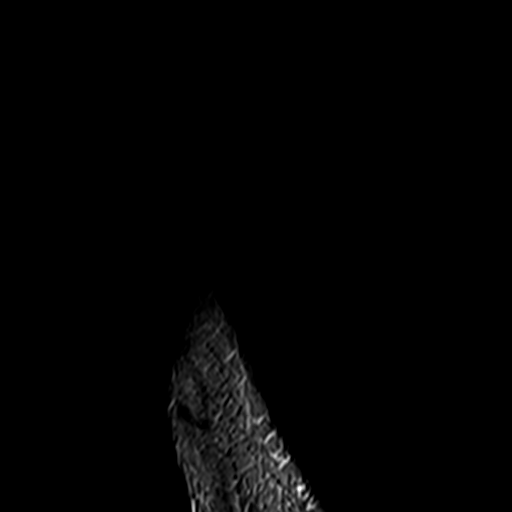
[im 12/23]
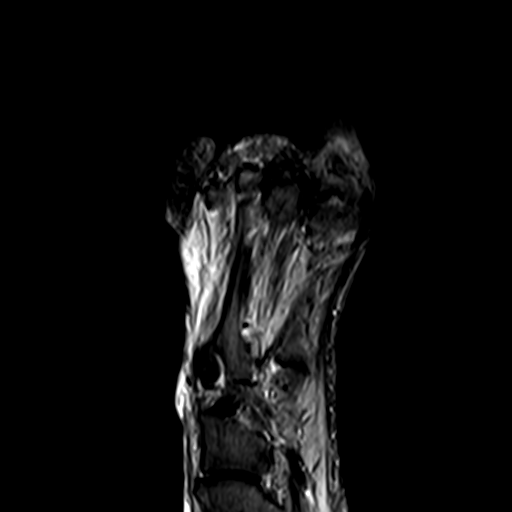
[im 23/23]
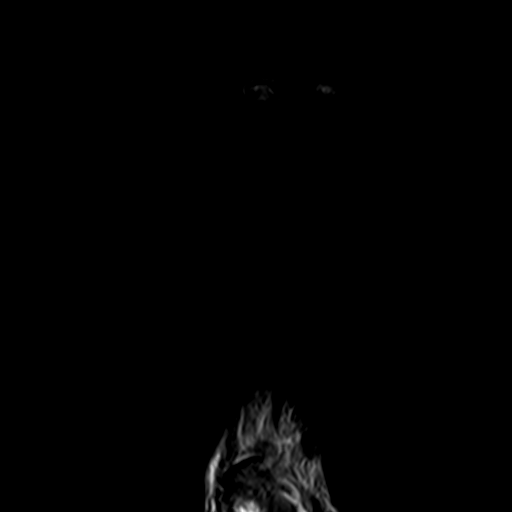

[Series 10: STIR · sagittal · 3.0mm · 0.41mm/px · 3 of 31 slices shown (2 of 2)]
[im 1/31]
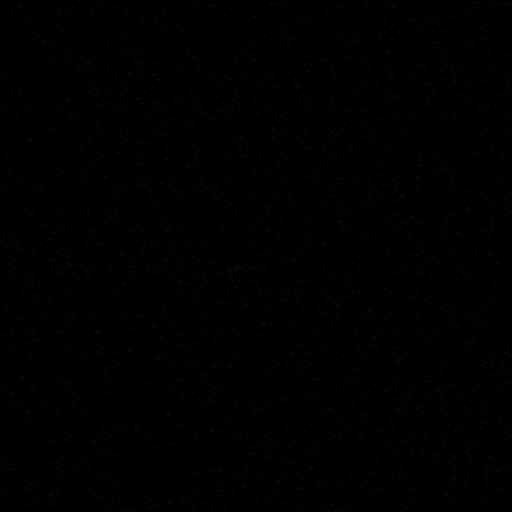
[im 11/31]
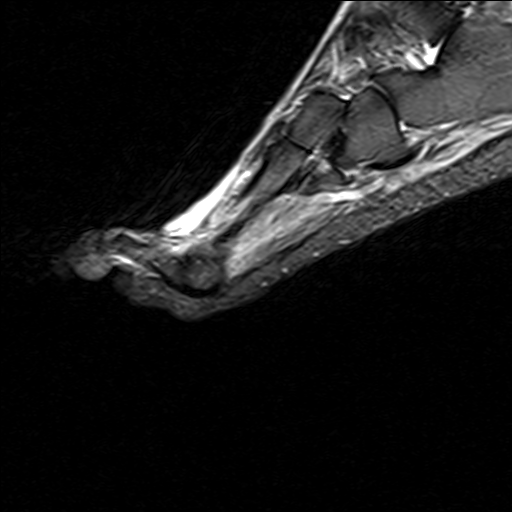
[im 21/31]
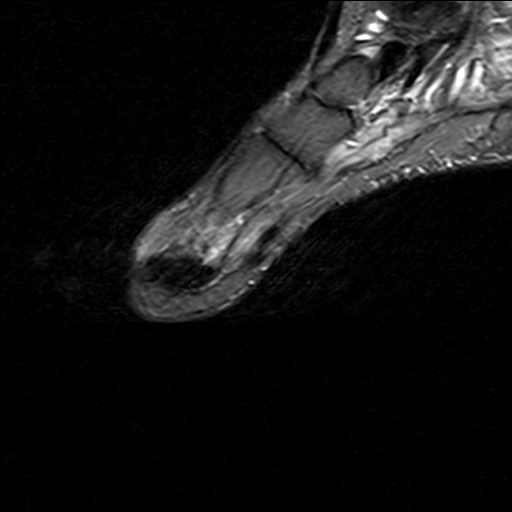

[Series 12: T1 fat-sat · coronal · 3.0mm · 0.66mm/px · 5 of 35 slices shown (1 of 3)]
[im 1/35]
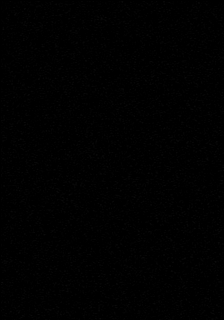
[im 9/35]
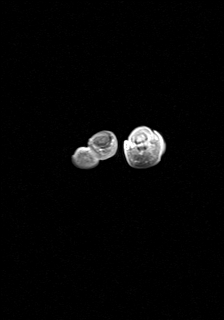
[im 18/35]
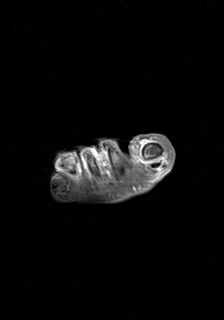
[im 26/35]
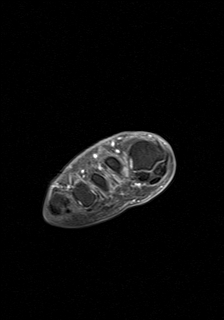
[im 35/35]
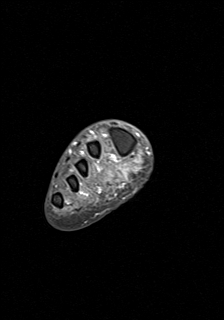

[Series 13: T1 fat-sat · axial · 3.0mm · 0.66mm/px · z∈[-162,-103]mm · 3 of 23 slices shown (2 of 3)]
[im 1/23]
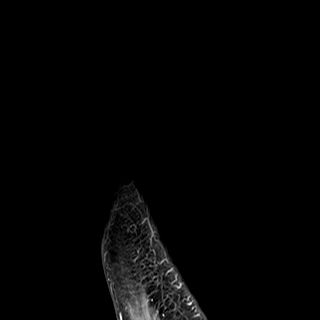
[im 12/23]
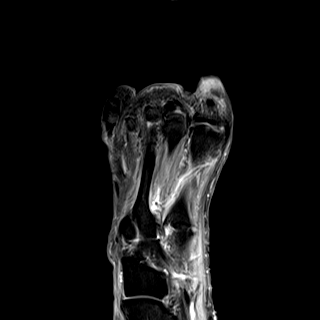
[im 23/23]
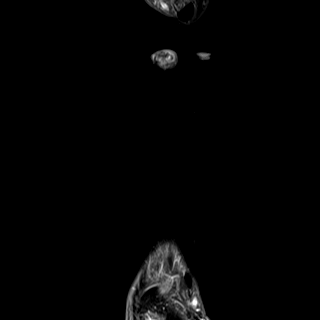

[Series 14: T1 fat-sat · sagittal · 3.0mm · 0.98mm/px · 4 of 28 slices shown (3 of 3)]
[im 1/28]
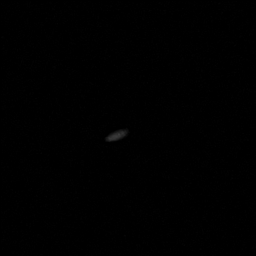
[im 10/28]
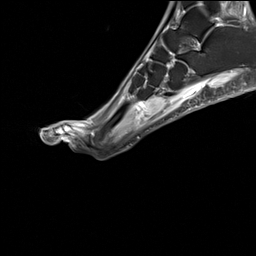
[im 19/28]
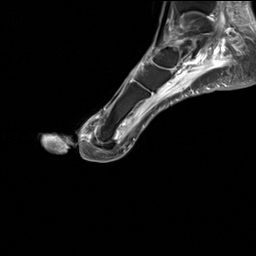
[im 28/28]
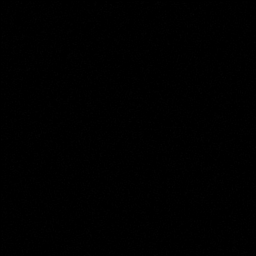

[36 of 40 positions shown; findings below may reference images not displayed]

FINDINGS: There is soft tissue edema and ulceration along the medial aspect of
the first IP joint. There is marrow edema involving the first distal
phalanx. There is minimal edema within the distal most aspect of the
first proximal phalanx with heterogeneous enhancement of the first
proximal phalanx. There is mild cortical irregularity along the
dorsal aspect of the first proximal phalanx. There is irregularity
of the medial cortex of the first distal phalanx with enhancement
consistent with osteomyelitis. There is no significant joint
effusion.

The remainder of the marrow signal throughout the right foot is
normal. There are no other areas of abnormal enhancement. There is
no other fluid collection, hematoma or soft tissue mass.
IMPRESSION: 1. Findings most consistent with osteomyelitis of the first distal
phalanx of the right foot. There is minimal edema with enhancement
of the first proximal phalanx with mild cortical irregularity along
the dorsal aspect of the first proximal phalanx concerning for
osteomyelitis. There is lack of enhancement involving the medial
half of the first proximal phalanx which may reflect nonvascularized
bone.
These results were called by telephone at the time of interpretation
on 07/04/2013 at [DATE] to Dr. JOSIEL ALEJANDRO LEIRDAY who verbally acknowledged
these results.

## 2016-01-15 DEATH — deceased
